# Patient Record
Sex: Male | Born: 1995 | Race: White | Hispanic: Yes | Marital: Single | Smoking: Never smoker
Health system: Southern US, Community
[De-identification: ages and names within clinical notes are randomized; demographics above are authoritative.]

---
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
## 2018-01-13 IMAGING — CR DG ANKLE COMPLETE 3+V*R*
1 series · 3 of 3 positions shown · non-contrast
Comparison: None.

CLINICAL DATA: Twisting injury to right ankle.  Initial encounter.

EXAM:
RIGHT ANKLE - COMPLETE 3+ VIEW

[Series 1: x ankle ap right · 0.14mm/px · 3 of 3 slices shown]
[im 1/3]
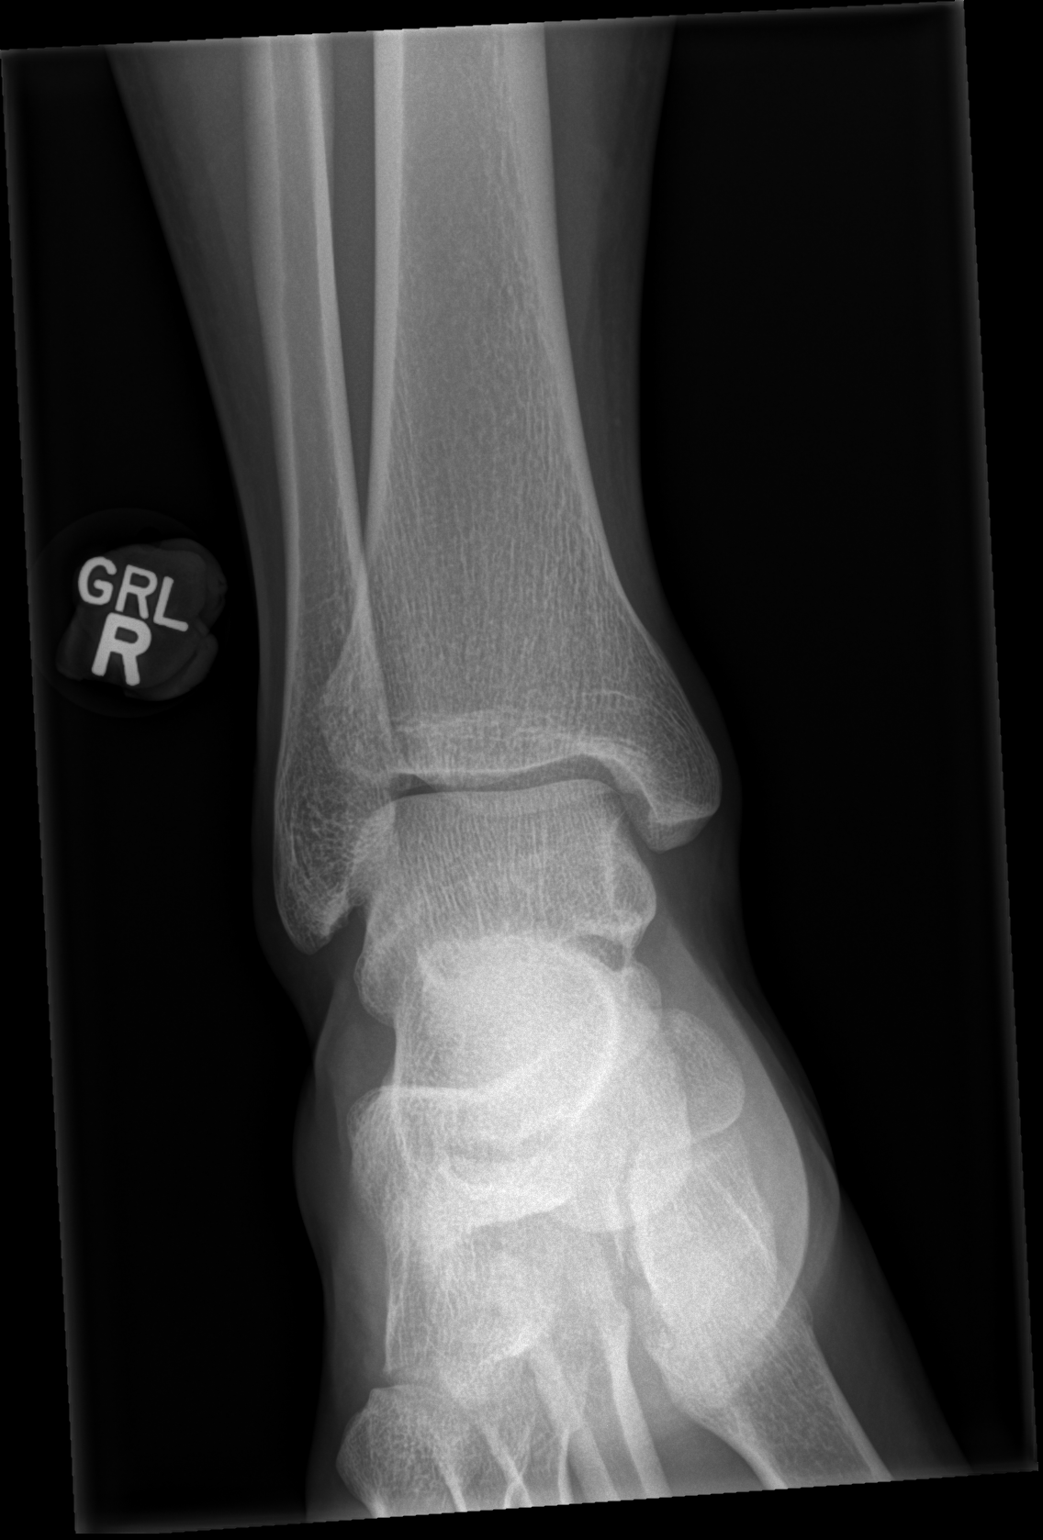
[im 2/3]
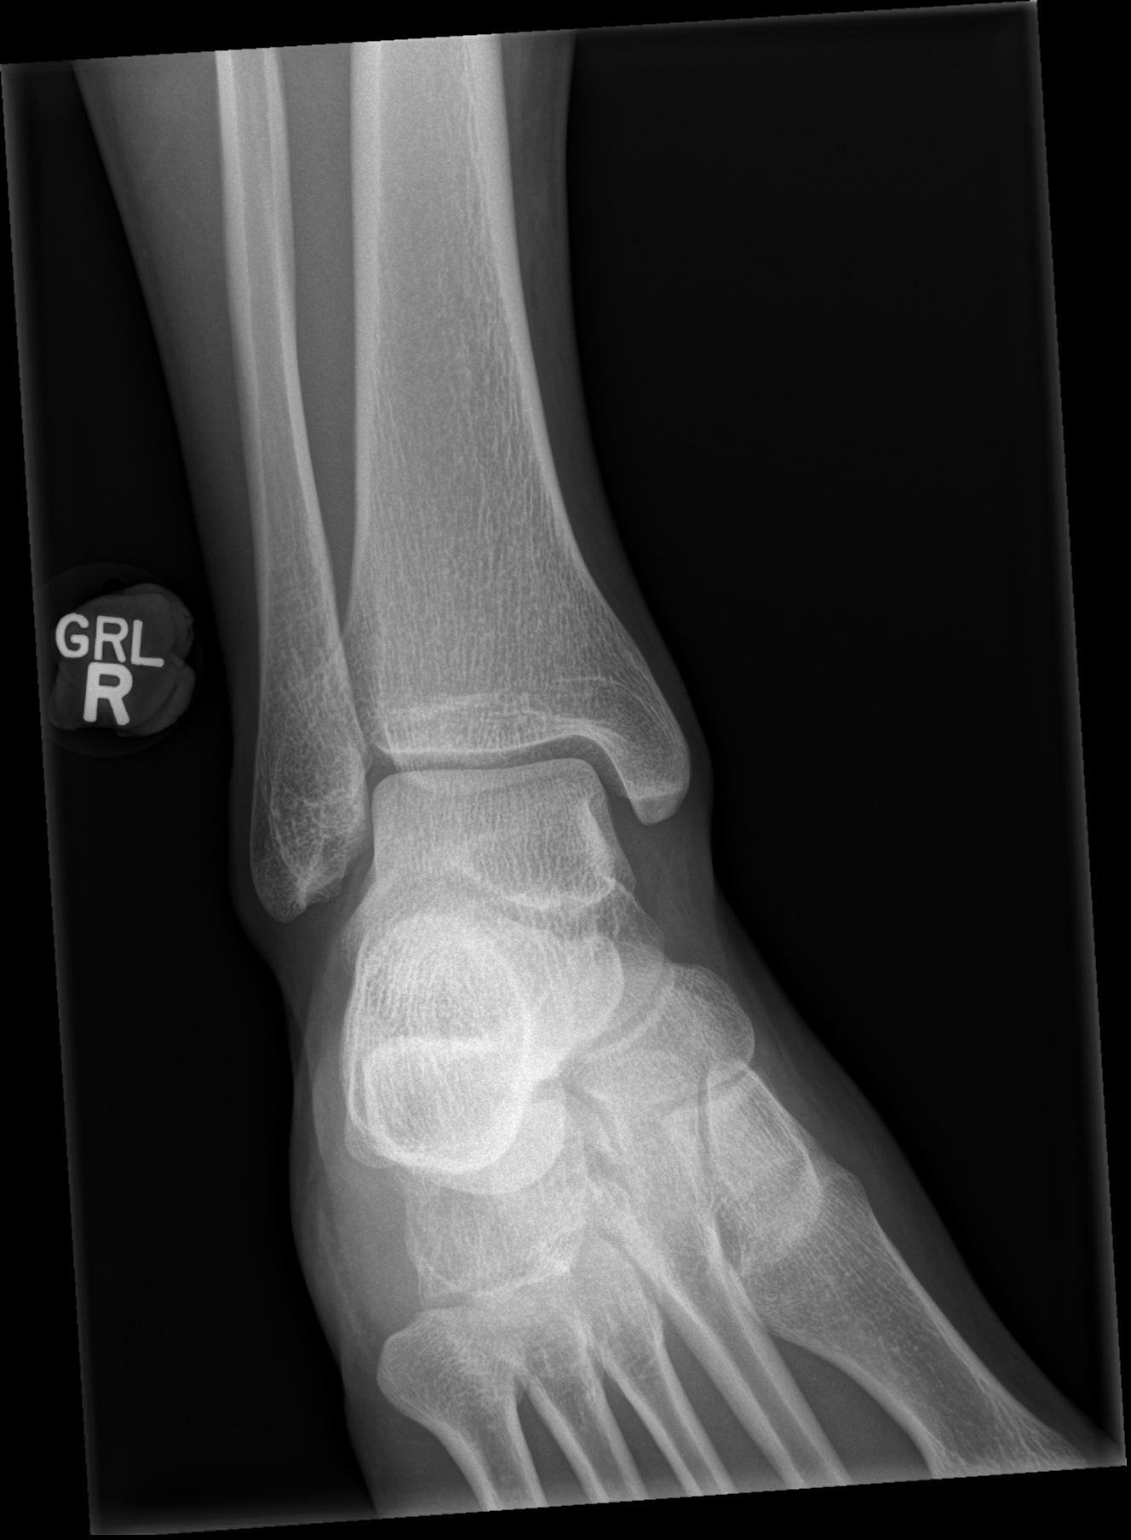
[im 3/3]
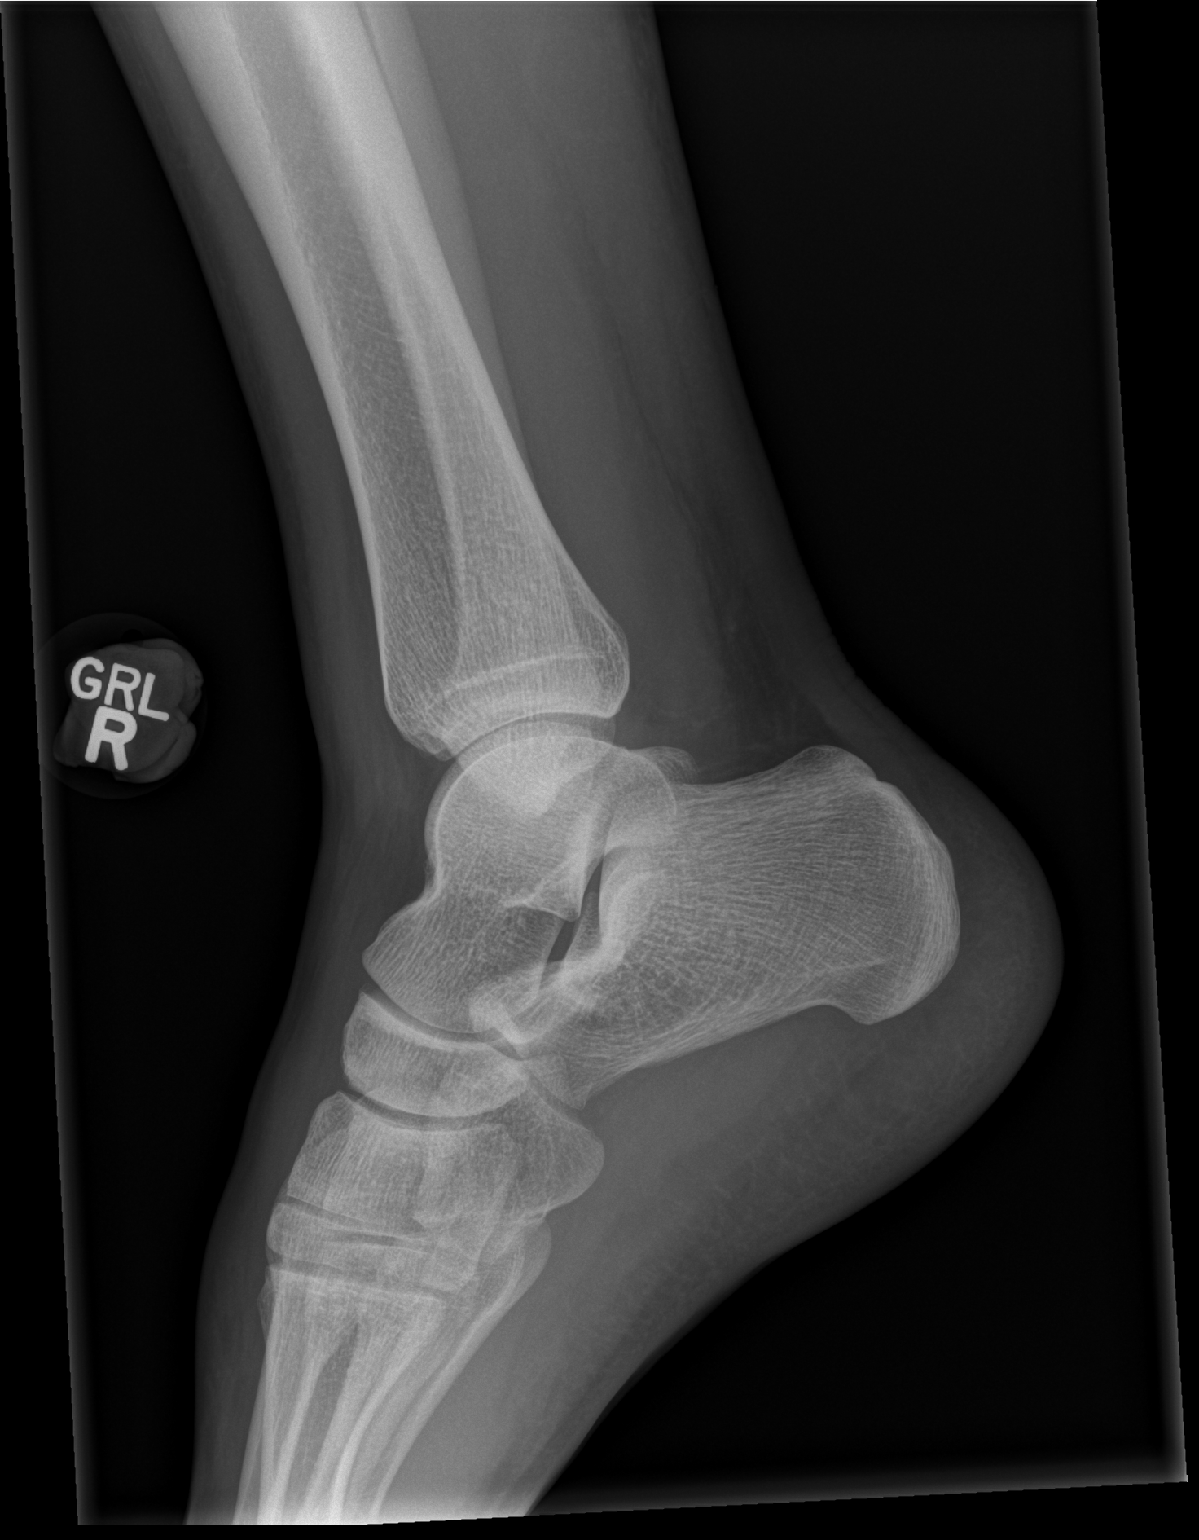

[3 of 3 positions shown; findings below may reference images not displayed]

FINDINGS: There is no evidence of fracture or dislocation. The ankle mortise
is intact; the interosseous space is within normal limits. No talar
tilt or subluxation is seen.

The joint spaces are preserved. No significant soft tissue
abnormalities are seen.
IMPRESSION: No evidence of fracture or dislocation.

## 2024-02-12 ENCOUNTER — Other Ambulatory Visit: Payer: Self-pay

## 2024-02-12 ENCOUNTER — Ambulatory Visit
Admission: RE | Admit: 2024-02-12 | Discharge: 2024-02-12 | Disposition: A | Discharge status: Home / Self Care | Source: Ambulatory Visit | Attending: Emergency Medicine | Admitting: Emergency Medicine

## 2024-02-12 VITALS — BP 149/92 | HR 78 | Temp 98.2°F | Resp 20

## 2024-02-12 DIAGNOSIS — M7989 Other specified soft tissue disorders: Secondary | ICD-10-CM

## 2024-02-12 DIAGNOSIS — T63451A Toxic effect of venom of hornets, accidental (unintentional), initial encounter: Secondary | ICD-10-CM | POA: Diagnosis not present

## 2024-02-12 MED ORDER — PREDNISONE 10 MG (21) PO TBPK: ORAL_TABLET | Freq: Every day | ORAL | 0 refills | Status: AC

## 2024-02-12 MED ORDER — DEXAMETHASONE SODIUM PHOSPHATE 10 MG/ML IJ SOLN
10.0000 mg | Freq: Once | INTRAMUSCULAR | Status: AC
  Administered 2024-02-12: 10 mg via INTRAMUSCULAR

## 2024-02-12 NOTE — Discharge Instructions (Signed)
 Today you were evaluated for the pain and swelling of your finger which I believe is a localized reaction to the hornet sting, no signs of infection at this time  You have been given an injection of a steroid to reduce swelling and help reduce pain and ideally will start to see improvement within the next hour  Starting tomorrow take prednisone every morning with food as directed to continue the process above  During treatment you may take Tylenol as needed for pain  You may help warm compresses over the affected area 10 to 15-minute intervals  At any point if you begin to see redness, heat or warmth to your skin, fever chills or drainage please follow-up for reevaluation as these are now signs of infection

## 2024-02-12 NOTE — ED Provider Notes (Signed)
 David Friedman    CSN: [id number] Arrival date & time: 02/12/24  4034      History   Chief Complaint Chief Complaint  Patient presents with   Allergic Reaction    Entered by patient   Insect Bite    HPI David Friedman is a 28 y.o. male.   Patient presents for evaluation of pain and swelling to the right index finger beginning 2 days ago after hornet sting.  Has limited range of motion unable to fully extend or flex.  Endorses numbness and tingling to the entire finger.  Has been stung before without prior complication.  Has not attempted treatment.  Denies drainage, redness, fever or chills.  Denies respiratory symptoms.  History reviewed. No pertinent past medical history.  There are no active problems to display for this patient.   History reviewed. No pertinent surgical history.     Home Medications    Prior to Admission medications   Medication Sig Start Date End Date Taking? Authorizing Provider  predniSONE (STERAPRED UNI-PAK 21 TAB) 10 MG (21) TBPK tablet Take by mouth daily. Take 6 tabs by mouth daily  for 1 days, then 5 tabs for 1 days, then 4 tabs for 1 days, then 3 tabs for 1 days, 2 tabs for 1 days, then 1 tab by mouth daily for 1 days 02/12/24  Yes Koben Daman R, NP  ibuprofen  (ADVIL ,MOTRIN ) 800 MG tablet Take 1 tablet (800 mg total) by mouth every 8 (eight) hours as needed. 04/14/16   Coralyn Derry, PA-C    Family History No family history on file.  Social History Social History   Tobacco Use   Smoking status: Never   Smokeless tobacco: Never  Vaping Use   Vaping status: Never Used  Substance Use Topics   Alcohol use: Yes   Drug use: Never     Allergies   Patient has no known allergies.   Review of Systems Review of Systems   Physical Exam Triage Vital Signs ED Triage Vitals  Encounter Vitals Group     BP 02/12/24 0834 (!) 149/92     Girls Systolic BP Percentile --      Girls Diastolic BP Percentile --       Boys Systolic BP Percentile --      Boys Diastolic BP Percentile --      Pulse Rate 02/12/24 0834 78     Resp 02/12/24 0834 20     Temp 02/12/24 0834 98.2 F (36.8 C)     Temp Source 02/12/24 0834 Oral     SpO2 02/12/24 0834 98 %     Weight --      Height --      Head Circumference --      Peak Flow --      Pain Score 02/12/24 0832 7     Pain Loc --      Pain Education --      Exclude from Growth Chart --    No data found.  Updated Vital Signs BP (!) 149/92 (BP Location: Left Arm) Comment (BP Location): large cuff  Pulse 78   Temp 98.2 F (36.8 C) (Oral)   Resp 20   SpO2 98%   Visual Acuity Right Eye Distance:   Left Eye Distance:   Bilateral Distance:    Right Eye Near:   Left Eye Near:    Bilateral Near:     Physical Exam Constitutional:      Appearance: Normal appearance.  Eyes:     Extraocular Movements: Extraocular movements intact.   Pulmonary:     Effort: Pulmonary effort is normal.   Skin:    Comments: Generalized swelling present to the right index finger, generalized tenderness, no erythema noted, skin cool to touch, sensation intact, capillary refill less than 3, limitations on range of motion   Neurological:     Mental Status: He is alert and oriented to person, place, and time. Mental status is at baseline.      UC Treatments / Results  Labs (all labs ordered are listed, but only abnormal results are displayed) Labs Reviewed - No data to display  EKG   Radiology No results found.  Procedures Procedures (including critical care time)  Medications Ordered in UC Medications  dexamethasone (DECADRON) injection 10 mg (has no administration in time range)    Initial Impression / Assessment and Plan / UC Course  I have reviewed the triage vital signs and the nursing notes.  Pertinent labs & imaging results that were available during my care of the patient were reviewed by me and considered in my medical decision making (see chart for  details).  Swelling of right index finger, hornet sting accidental or unintentional  Presentation consistent with a localized reaction, no signs of infection at this time, Decadron IM given and prescribed prednisone for home use recommended supportive care and advised follow-up as needed Final Clinical Impressions(s) / UC Diagnoses   Final diagnoses:  Swelling of right index finger  Hornet sting, accidental or unintentional, initial encounter     Discharge Instructions      Today you were evaluated for the pain and swelling of your finger which I believe is a localized reaction to the hornet sting, no signs of infection at this time  You have been given an injection of a steroid to reduce swelling and help reduce pain and ideally will start to see improvement within the next hour  Starting tomorrow take prednisone every morning with food as directed to continue the process above  During treatment you may take Tylenol as needed for pain  You may help warm compresses over the affected area 10 to 15-minute intervals  At any point if you begin to see redness, heat or warmth to your skin, fever chills or drainage please follow-up for reevaluation as these are now signs of infection   ED Prescriptions     Medication Sig Dispense Auth. Provider   predniSONE (STERAPRED UNI-PAK 21 TAB) 10 MG (21) TBPK tablet Take by mouth daily. Take 6 tabs by mouth daily  for 1 days, then 5 tabs for 1 days, then 4 tabs for 1 days, then 3 tabs for 1 days, 2 tabs for 1 days, then 1 tab by mouth daily for 1 days 21 tablet Kieli Golladay, Maybelle Spatz, NP      PDMP not reviewed this encounter.   Reena Canning, [place] 02/12/24 [phone]

## 2024-02-12 NOTE — ED Triage Notes (Signed)
 Insect stinging, thought to be a hornet sting that occurred 2 days ago while doing yard work. Right index finger and surrounding area is swollen and itching.    Denies use of any medications or creams
